# Patient Record
Sex: Female | Born: 1950 | Race: White | Hispanic: No | Marital: Married | Smoking: Former smoker
Health system: Southern US, Community
[De-identification: ages and names within clinical notes are randomized; demographics above are authoritative.]

---

## 1998-07-14 ENCOUNTER — Other Ambulatory Visit: Admission: RE | Admit: 1998-07-14 | Discharge: 1998-07-14 | Payer: Self-pay | Admitting: Family Medicine

## 2002-01-11 ENCOUNTER — Encounter: Payer: Self-pay | Admitting: Family Medicine

## 2002-01-11 ENCOUNTER — Encounter: Admission: RE | Admit: 2002-01-11 | Discharge: 2002-01-11 | Payer: Self-pay | Admitting: Family Medicine

## 2002-03-26 ENCOUNTER — Encounter: Payer: Self-pay | Admitting: Family Medicine

## 2002-03-26 ENCOUNTER — Encounter: Admission: RE | Admit: 2002-03-26 | Discharge: 2002-03-26 | Payer: Self-pay | Admitting: Family Medicine

## 2002-04-05 ENCOUNTER — Ambulatory Visit (HOSPITAL_COMMUNITY): Admission: RE | Admit: 2002-04-05 | Discharge: 2002-04-05 | Payer: Self-pay | Admitting: Gastroenterology

## 2002-04-24 ENCOUNTER — Encounter: Admission: RE | Admit: 2002-04-24 | Discharge: 2002-04-24 | Payer: Self-pay | Admitting: Family Medicine

## 2002-04-24 ENCOUNTER — Encounter: Payer: Self-pay | Admitting: Family Medicine

## 2003-01-04 ENCOUNTER — Encounter: Admission: RE | Admit: 2003-01-04 | Discharge: 2003-01-04 | Payer: Self-pay | Admitting: Family Medicine

## 2003-01-04 ENCOUNTER — Encounter: Payer: Self-pay | Admitting: Family Medicine

## 2003-04-02 ENCOUNTER — Ambulatory Visit (HOSPITAL_COMMUNITY): Admission: RE | Admit: 2003-04-02 | Discharge: 2003-04-02 | Payer: Self-pay | Admitting: Family Medicine

## 2003-04-02 ENCOUNTER — Encounter: Payer: Self-pay | Admitting: Family Medicine

## 2004-04-08 ENCOUNTER — Encounter: Admission: RE | Admit: 2004-04-08 | Discharge: 2004-04-08 | Payer: Self-pay | Admitting: Family Medicine

## 2004-09-17 ENCOUNTER — Other Ambulatory Visit: Admission: RE | Admit: 2004-09-17 | Discharge: 2004-09-17 | Payer: Self-pay | Admitting: Family Medicine

## 2005-04-13 ENCOUNTER — Ambulatory Visit (HOSPITAL_COMMUNITY): Admission: RE | Admit: 2005-04-13 | Discharge: 2005-04-13 | Payer: Self-pay | Admitting: Family Medicine

## 2005-09-28 ENCOUNTER — Other Ambulatory Visit: Admission: RE | Admit: 2005-09-28 | Discharge: 2005-09-28 | Payer: Self-pay | Admitting: Family Medicine

## 2005-09-28 ENCOUNTER — Encounter: Admission: RE | Admit: 2005-09-28 | Discharge: 2005-09-28 | Payer: Self-pay | Admitting: Family Medicine

## 2006-04-21 ENCOUNTER — Ambulatory Visit (HOSPITAL_COMMUNITY): Admission: RE | Admit: 2006-04-21 | Discharge: 2006-04-21 | Payer: Self-pay | Admitting: Family Medicine

## 2006-09-29 ENCOUNTER — Other Ambulatory Visit: Admission: RE | Admit: 2006-09-29 | Discharge: 2006-09-29 | Payer: Self-pay | Admitting: Family Medicine

## 2006-10-11 ENCOUNTER — Encounter: Admission: RE | Admit: 2006-10-11 | Discharge: 2006-10-11 | Payer: Self-pay | Admitting: Family Medicine

## 2007-04-24 ENCOUNTER — Ambulatory Visit (HOSPITAL_COMMUNITY): Admission: RE | Admit: 2007-04-24 | Discharge: 2007-04-24 | Payer: Self-pay | Admitting: Family Medicine

## 2007-10-10 ENCOUNTER — Other Ambulatory Visit: Admission: RE | Admit: 2007-10-10 | Discharge: 2007-10-10 | Payer: Self-pay | Admitting: Family Medicine

## 2008-04-24 ENCOUNTER — Ambulatory Visit (HOSPITAL_COMMUNITY): Admission: RE | Admit: 2008-04-24 | Discharge: 2008-04-24 | Payer: Self-pay | Admitting: Family Medicine

## 2008-10-14 ENCOUNTER — Other Ambulatory Visit: Admission: RE | Admit: 2008-10-14 | Discharge: 2008-10-14 | Payer: Self-pay | Admitting: Family Medicine

## 2009-04-28 ENCOUNTER — Ambulatory Visit (HOSPITAL_COMMUNITY): Admission: RE | Admit: 2009-04-28 | Discharge: 2009-04-28 | Payer: Self-pay | Admitting: Family Medicine

## 2009-10-21 ENCOUNTER — Other Ambulatory Visit: Admission: RE | Admit: 2009-10-21 | Discharge: 2009-10-21 | Payer: Self-pay | Admitting: Family Medicine

## 2009-10-31 ENCOUNTER — Other Ambulatory Visit: Admission: RE | Admit: 2009-10-31 | Discharge: 2009-10-31 | Payer: Self-pay | Admitting: Family Medicine

## 2010-04-30 ENCOUNTER — Ambulatory Visit (HOSPITAL_COMMUNITY): Admission: RE | Admit: 2010-04-30 | Discharge: 2010-04-30 | Payer: Self-pay | Admitting: Family Medicine

## 2011-03-29 ENCOUNTER — Other Ambulatory Visit (HOSPITAL_COMMUNITY): Payer: Self-pay | Admitting: Family Medicine

## 2011-03-29 DIAGNOSIS — Z1231 Encounter for screening mammogram for malignant neoplasm of breast: Secondary | ICD-10-CM

## 2011-04-09 NOTE — Procedures (Signed)
[place]. [facility]  Patient:    Gail Ellis, Gail Ellis Visit Number: [id number] MRN: [id number]          Service Type: END Location: ENDO Attending Physician:  Dennison Bulla Ii Dictated by:   Verlin Grills, M.D. Proc. Date: 04/05/02 Admit Date:  04/05/2002 Discharge Date: 04/05/2002   CC:         Desma Maxim, M.D.   Procedure Report  DATE OF BIRTH:  Oct 18, 1951.  REFERRING PHYSICIAN:  PROCEDURE PERFORMED:  Colonoscopy.  ENDOSCOPIST:  Verlin Grills, M.D.  INDICATIONS FOR PROCEDURE:  The patient is a 60 year old female.  [name] is to undergo her first screening colonoscopy with polypectomy to prevent colon cancer.  I discussed with the patient the complications associated with colonoscopy and polypectomy including a 15 per 1000 risk of bleeding and 4 per 1000 risk of colon perforation requiring surgical repair.  The patient has signed the operative permit.  PREMEDICATION:  Versed 5 mg, Demerol 50 mg.  ENDOSCOPE:  Olympus video colonoscope.  DESCRIPTION OF PROCEDURE:  After obtaining informed consent, the patient was placed in the left lateral decubitus position.  I administered intravenous Demerol and intravenous Versed to achieve conscious sedation for the procedure.  The patients blood pressure, oxygen saturation and cardiac rhythm were monitored throughout the procedure and documented in the medical record.  Anal inspection was normal.  Digital rectal exam was normal.  The Olympus pediatric video colonoscope was then introduced into the rectum and advanced to the cecum.  Colonic preparation for the exam today was excellent.  Rectum:  Normal.  Sigmoid colon and descending colon:  Normal.  Splenic flexure:  Normal.  Transverse colon:  Normal.  Hepatic flexure:  Normal.  Ascending colon:  Normal.  Cecum and ileocecal valve:  Normal.  ASSESSMENT:  Normal screening proctocolonoscopy to the cecum.   No endoscopic evidence for the presence of colorectal neoplasia.  Screening colonoscopy for colonic neoplasia misses 6% of polyps over 1 cm in size and 13% of polyps 5 to 9 mm in size. Dictated by:   Verlin Grills, M.D. Attending Physician:  Dennison Bulla Ii DD:  04/05/02 TD:  04/06/02 Job: [id number]

## 2011-05-04 ENCOUNTER — Ambulatory Visit (HOSPITAL_COMMUNITY)
Admission: RE | Admit: 2011-05-04 | Discharge: 2011-05-04 | Disposition: A | Discharge status: Home / Self Care | Payer: BC Managed Care – PPO | Source: Ambulatory Visit | Attending: Family Medicine | Admitting: Family Medicine

## 2011-05-04 DIAGNOSIS — Z1231 Encounter for screening mammogram for malignant neoplasm of breast: Secondary | ICD-10-CM | POA: Insufficient documentation

## 2011-11-01 ENCOUNTER — Other Ambulatory Visit (HOSPITAL_COMMUNITY)
Admission: RE | Admit: 2011-11-01 | Discharge: 2011-11-01 | Disposition: A | Discharge status: Home / Self Care | Payer: BC Managed Care – PPO | Source: Ambulatory Visit | Attending: Family Medicine | Admitting: Family Medicine

## 2011-11-01 ENCOUNTER — Other Ambulatory Visit: Payer: Self-pay | Admitting: Family Medicine

## 2011-11-01 DIAGNOSIS — Z124 Encounter for screening for malignant neoplasm of cervix: Secondary | ICD-10-CM | POA: Insufficient documentation

## 2012-04-18 ENCOUNTER — Other Ambulatory Visit (HOSPITAL_COMMUNITY): Payer: Self-pay | Admitting: Family Medicine

## 2012-04-18 DIAGNOSIS — Z1231 Encounter for screening mammogram for malignant neoplasm of breast: Secondary | ICD-10-CM

## 2012-05-11 ENCOUNTER — Ambulatory Visit (HOSPITAL_COMMUNITY)
Admission: RE | Admit: 2012-05-11 | Discharge: 2012-05-11 | Disposition: A | Discharge status: Home / Self Care | Payer: BC Managed Care – PPO | Source: Ambulatory Visit | Attending: Family Medicine | Admitting: Family Medicine

## 2012-05-11 DIAGNOSIS — Z1231 Encounter for screening mammogram for malignant neoplasm of breast: Secondary | ICD-10-CM | POA: Insufficient documentation

## 2013-04-19 ENCOUNTER — Other Ambulatory Visit (HOSPITAL_COMMUNITY): Payer: Self-pay | Admitting: Family Medicine

## 2013-04-19 DIAGNOSIS — Z1231 Encounter for screening mammogram for malignant neoplasm of breast: Secondary | ICD-10-CM

## 2013-05-17 ENCOUNTER — Ambulatory Visit (HOSPITAL_COMMUNITY)
Admission: RE | Admit: 2013-05-17 | Discharge: 2013-05-17 | Disposition: A | Discharge status: Home / Self Care | Payer: BC Managed Care – PPO | Source: Ambulatory Visit | Attending: Family Medicine | Admitting: Family Medicine

## 2013-05-17 DIAGNOSIS — Z1231 Encounter for screening mammogram for malignant neoplasm of breast: Secondary | ICD-10-CM | POA: Insufficient documentation

## 2014-04-08 ENCOUNTER — Other Ambulatory Visit (HOSPITAL_COMMUNITY): Payer: Self-pay | Admitting: Family Medicine

## 2014-04-08 DIAGNOSIS — Z1231 Encounter for screening mammogram for malignant neoplasm of breast: Secondary | ICD-10-CM

## 2014-06-04 ENCOUNTER — Ambulatory Visit (HOSPITAL_COMMUNITY): Payer: BC Managed Care – PPO

## 2014-06-17 ENCOUNTER — Ambulatory Visit (HOSPITAL_COMMUNITY)
Admission: RE | Admit: 2014-06-17 | Discharge: 2014-06-17 | Disposition: A | Discharge status: Home / Self Care | Payer: BC Managed Care – PPO | Source: Ambulatory Visit | Attending: Family Medicine | Admitting: Family Medicine

## 2014-06-17 ENCOUNTER — Other Ambulatory Visit (HOSPITAL_COMMUNITY): Payer: Self-pay | Admitting: Family Medicine

## 2014-06-17 DIAGNOSIS — Z1231 Encounter for screening mammogram for malignant neoplasm of breast: Secondary | ICD-10-CM | POA: Insufficient documentation

## 2014-12-26 ENCOUNTER — Other Ambulatory Visit: Payer: Self-pay | Admitting: Family Medicine

## 2014-12-26 ENCOUNTER — Other Ambulatory Visit (HOSPITAL_COMMUNITY)
Admission: RE | Admit: 2014-12-26 | Discharge: 2014-12-26 | Disposition: A | Discharge status: Home / Self Care | Payer: BLUE CROSS/BLUE SHIELD | Source: Ambulatory Visit | Attending: Family Medicine | Admitting: Family Medicine

## 2014-12-26 DIAGNOSIS — Z124 Encounter for screening for malignant neoplasm of cervix: Secondary | ICD-10-CM | POA: Diagnosis not present

## 2014-12-26 DIAGNOSIS — Z1151 Encounter for screening for human papillomavirus (HPV): Secondary | ICD-10-CM | POA: Insufficient documentation

## 2015-01-01 LAB — CYTOLOGY - PAP

## 2015-05-09 ENCOUNTER — Other Ambulatory Visit (HOSPITAL_COMMUNITY): Payer: Self-pay | Admitting: Family Medicine

## 2015-05-09 DIAGNOSIS — Z1231 Encounter for screening mammogram for malignant neoplasm of breast: Secondary | ICD-10-CM

## 2015-06-25 ENCOUNTER — Ambulatory Visit (HOSPITAL_COMMUNITY)
Admission: RE | Admit: 2015-06-25 | Discharge: 2015-06-25 | Disposition: A | Discharge status: Home / Self Care | Payer: BLUE CROSS/BLUE SHIELD | Source: Ambulatory Visit | Attending: Family Medicine | Admitting: Family Medicine

## 2015-06-25 ENCOUNTER — Other Ambulatory Visit (HOSPITAL_COMMUNITY): Payer: Self-pay | Admitting: Family Medicine

## 2015-06-25 DIAGNOSIS — Z1231 Encounter for screening mammogram for malignant neoplasm of breast: Secondary | ICD-10-CM

## 2016-02-03 DIAGNOSIS — M810 Age-related osteoporosis without current pathological fracture: Secondary | ICD-10-CM | POA: Diagnosis not present

## 2016-02-03 DIAGNOSIS — Z79899 Other long term (current) drug therapy: Secondary | ICD-10-CM | POA: Diagnosis not present

## 2016-02-03 DIAGNOSIS — G4700 Insomnia, unspecified: Secondary | ICD-10-CM | POA: Diagnosis not present

## 2016-02-03 DIAGNOSIS — M1990 Unspecified osteoarthritis, unspecified site: Secondary | ICD-10-CM | POA: Diagnosis not present

## 2016-02-03 DIAGNOSIS — G43909 Migraine, unspecified, not intractable, without status migrainosus: Secondary | ICD-10-CM | POA: Diagnosis not present

## 2016-02-03 DIAGNOSIS — N952 Postmenopausal atrophic vaginitis: Secondary | ICD-10-CM | POA: Diagnosis not present

## 2016-02-03 DIAGNOSIS — H3530 Unspecified macular degeneration: Secondary | ICD-10-CM | POA: Diagnosis not present

## 2016-02-03 DIAGNOSIS — Z136 Encounter for screening for cardiovascular disorders: Secondary | ICD-10-CM | POA: Diagnosis not present

## 2016-02-03 DIAGNOSIS — Z0000 Encounter for general adult medical examination without abnormal findings: Secondary | ICD-10-CM | POA: Diagnosis not present

## 2016-02-03 DIAGNOSIS — E559 Vitamin D deficiency, unspecified: Secondary | ICD-10-CM | POA: Diagnosis not present

## 2016-04-05 DIAGNOSIS — H6120 Impacted cerumen, unspecified ear: Secondary | ICD-10-CM | POA: Diagnosis not present

## 2016-06-03 ENCOUNTER — Other Ambulatory Visit: Payer: Self-pay | Admitting: Family Medicine

## 2016-06-03 DIAGNOSIS — Z1231 Encounter for screening mammogram for malignant neoplasm of breast: Secondary | ICD-10-CM

## 2016-07-12 ENCOUNTER — Ambulatory Visit: Payer: BLUE CROSS/BLUE SHIELD

## 2016-07-23 ENCOUNTER — Ambulatory Visit
Admission: RE | Admit: 2016-07-23 | Discharge: 2016-07-23 | Disposition: A | Discharge status: Home / Self Care | Payer: Medicare Other | Source: Ambulatory Visit | Attending: Family Medicine | Admitting: Family Medicine

## 2016-07-23 DIAGNOSIS — Z1231 Encounter for screening mammogram for malignant neoplasm of breast: Secondary | ICD-10-CM | POA: Diagnosis not present

## 2016-08-09 DIAGNOSIS — Z23 Encounter for immunization: Secondary | ICD-10-CM | POA: Diagnosis not present

## 2016-08-11 DIAGNOSIS — M810 Age-related osteoporosis without current pathological fracture: Secondary | ICD-10-CM | POA: Diagnosis not present

## 2016-08-17 DIAGNOSIS — H5203 Hypermetropia, bilateral: Secondary | ICD-10-CM | POA: Diagnosis not present

## 2016-08-17 DIAGNOSIS — H353131 Nonexudative age-related macular degeneration, bilateral, early dry stage: Secondary | ICD-10-CM | POA: Diagnosis not present

## 2016-08-17 DIAGNOSIS — H353122 Nonexudative age-related macular degeneration, left eye, intermediate dry stage: Secondary | ICD-10-CM | POA: Diagnosis not present

## 2016-09-06 DIAGNOSIS — D225 Melanocytic nevi of trunk: Secondary | ICD-10-CM | POA: Diagnosis not present

## 2016-09-06 DIAGNOSIS — D2261 Melanocytic nevi of right upper limb, including shoulder: Secondary | ICD-10-CM | POA: Diagnosis not present

## 2016-09-06 DIAGNOSIS — D234 Other benign neoplasm of skin of scalp and neck: Secondary | ICD-10-CM | POA: Diagnosis not present

## 2016-09-06 DIAGNOSIS — L821 Other seborrheic keratosis: Secondary | ICD-10-CM | POA: Diagnosis not present

## 2016-09-06 DIAGNOSIS — L280 Lichen simplex chronicus: Secondary | ICD-10-CM | POA: Diagnosis not present

## 2016-11-18 DIAGNOSIS — L820 Inflamed seborrheic keratosis: Secondary | ICD-10-CM | POA: Diagnosis not present

## 2016-11-18 DIAGNOSIS — D485 Neoplasm of uncertain behavior of skin: Secondary | ICD-10-CM | POA: Diagnosis not present

## 2017-03-16 DIAGNOSIS — H6123 Impacted cerumen, bilateral: Secondary | ICD-10-CM | POA: Diagnosis not present

## 2017-03-24 ENCOUNTER — Other Ambulatory Visit: Payer: Self-pay | Admitting: Family Medicine

## 2017-03-24 ENCOUNTER — Other Ambulatory Visit (HOSPITAL_COMMUNITY)
Admission: RE | Admit: 2017-03-24 | Discharge: 2017-03-24 | Disposition: A | Discharge status: Home / Self Care | Payer: Medicare Other | Source: Ambulatory Visit | Attending: Family Medicine | Admitting: Family Medicine

## 2017-03-24 DIAGNOSIS — Z0000 Encounter for general adult medical examination without abnormal findings: Secondary | ICD-10-CM | POA: Diagnosis not present

## 2017-03-24 DIAGNOSIS — Z1159 Encounter for screening for other viral diseases: Secondary | ICD-10-CM | POA: Diagnosis not present

## 2017-03-24 DIAGNOSIS — Z79899 Other long term (current) drug therapy: Secondary | ICD-10-CM | POA: Diagnosis not present

## 2017-03-24 DIAGNOSIS — Z01419 Encounter for gynecological examination (general) (routine) without abnormal findings: Secondary | ICD-10-CM | POA: Diagnosis not present

## 2017-03-24 DIAGNOSIS — Z23 Encounter for immunization: Secondary | ICD-10-CM | POA: Diagnosis not present

## 2017-03-24 DIAGNOSIS — Z1211 Encounter for screening for malignant neoplasm of colon: Secondary | ICD-10-CM | POA: Diagnosis not present

## 2017-03-24 DIAGNOSIS — N952 Postmenopausal atrophic vaginitis: Secondary | ICD-10-CM | POA: Diagnosis not present

## 2017-03-24 DIAGNOSIS — M1990 Unspecified osteoarthritis, unspecified site: Secondary | ICD-10-CM | POA: Diagnosis not present

## 2017-03-24 DIAGNOSIS — E559 Vitamin D deficiency, unspecified: Secondary | ICD-10-CM | POA: Diagnosis not present

## 2017-03-24 DIAGNOSIS — Z1151 Encounter for screening for human papillomavirus (HPV): Secondary | ICD-10-CM | POA: Insufficient documentation

## 2017-03-24 DIAGNOSIS — Z124 Encounter for screening for malignant neoplasm of cervix: Secondary | ICD-10-CM | POA: Diagnosis not present

## 2017-03-24 DIAGNOSIS — M810 Age-related osteoporosis without current pathological fracture: Secondary | ICD-10-CM | POA: Diagnosis not present

## 2017-03-24 DIAGNOSIS — G4700 Insomnia, unspecified: Secondary | ICD-10-CM | POA: Diagnosis not present

## 2017-03-24 DIAGNOSIS — G43909 Migraine, unspecified, not intractable, without status migrainosus: Secondary | ICD-10-CM | POA: Diagnosis not present

## 2017-03-25 DIAGNOSIS — K130 Diseases of lips: Secondary | ICD-10-CM | POA: Diagnosis not present

## 2017-03-29 LAB — CYTOLOGY - PAP
DIAGNOSIS: NEGATIVE
HPV: NOT DETECTED

## 2017-05-02 DIAGNOSIS — M810 Age-related osteoporosis without current pathological fracture: Secondary | ICD-10-CM | POA: Diagnosis not present

## 2017-05-02 DIAGNOSIS — Z1211 Encounter for screening for malignant neoplasm of colon: Secondary | ICD-10-CM | POA: Diagnosis not present

## 2017-06-14 ENCOUNTER — Other Ambulatory Visit: Payer: Self-pay | Admitting: Family Medicine

## 2017-06-14 DIAGNOSIS — Z1231 Encounter for screening mammogram for malignant neoplasm of breast: Secondary | ICD-10-CM

## 2017-08-08 ENCOUNTER — Ambulatory Visit: Payer: Medicare Other

## 2017-08-15 ENCOUNTER — Ambulatory Visit
Admission: RE | Admit: 2017-08-15 | Discharge: 2017-08-15 | Disposition: A | Discharge status: Home / Self Care | Payer: Medicare Other | Source: Ambulatory Visit | Attending: Family Medicine | Admitting: Family Medicine

## 2017-08-15 DIAGNOSIS — Z1231 Encounter for screening mammogram for malignant neoplasm of breast: Secondary | ICD-10-CM

## 2017-08-19 DIAGNOSIS — H2513 Age-related nuclear cataract, bilateral: Secondary | ICD-10-CM | POA: Diagnosis not present

## 2017-08-19 DIAGNOSIS — Z23 Encounter for immunization: Secondary | ICD-10-CM | POA: Diagnosis not present

## 2017-08-19 DIAGNOSIS — H52203 Unspecified astigmatism, bilateral: Secondary | ICD-10-CM | POA: Diagnosis not present

## 2017-08-19 DIAGNOSIS — H353131 Nonexudative age-related macular degeneration, bilateral, early dry stage: Secondary | ICD-10-CM | POA: Diagnosis not present

## 2017-09-12 DIAGNOSIS — D224 Melanocytic nevi of scalp and neck: Secondary | ICD-10-CM | POA: Diagnosis not present

## 2017-09-12 DIAGNOSIS — L821 Other seborrheic keratosis: Secondary | ICD-10-CM | POA: Diagnosis not present

## 2017-09-12 DIAGNOSIS — D2261 Melanocytic nevi of right upper limb, including shoulder: Secondary | ICD-10-CM | POA: Diagnosis not present

## 2017-09-12 DIAGNOSIS — D225 Melanocytic nevi of trunk: Secondary | ICD-10-CM | POA: Diagnosis not present

## 2017-09-12 DIAGNOSIS — D1801 Hemangioma of skin and subcutaneous tissue: Secondary | ICD-10-CM | POA: Diagnosis not present

## 2017-09-14 DIAGNOSIS — M810 Age-related osteoporosis without current pathological fracture: Secondary | ICD-10-CM | POA: Diagnosis not present

## 2018-04-06 DIAGNOSIS — M1990 Unspecified osteoarthritis, unspecified site: Secondary | ICD-10-CM | POA: Diagnosis not present

## 2018-04-06 DIAGNOSIS — R030 Elevated blood-pressure reading, without diagnosis of hypertension: Secondary | ICD-10-CM | POA: Diagnosis not present

## 2018-04-06 DIAGNOSIS — Z0000 Encounter for general adult medical examination without abnormal findings: Secondary | ICD-10-CM | POA: Diagnosis not present

## 2018-04-06 DIAGNOSIS — E559 Vitamin D deficiency, unspecified: Secondary | ICD-10-CM | POA: Diagnosis not present

## 2018-04-06 DIAGNOSIS — H3530 Unspecified macular degeneration: Secondary | ICD-10-CM | POA: Diagnosis not present

## 2018-04-06 DIAGNOSIS — G43909 Migraine, unspecified, not intractable, without status migrainosus: Secondary | ICD-10-CM | POA: Diagnosis not present

## 2018-04-06 DIAGNOSIS — M810 Age-related osteoporosis without current pathological fracture: Secondary | ICD-10-CM | POA: Diagnosis not present

## 2018-04-06 DIAGNOSIS — Z136 Encounter for screening for cardiovascular disorders: Secondary | ICD-10-CM | POA: Diagnosis not present

## 2018-04-06 DIAGNOSIS — N952 Postmenopausal atrophic vaginitis: Secondary | ICD-10-CM | POA: Diagnosis not present

## 2018-04-06 DIAGNOSIS — Z79899 Other long term (current) drug therapy: Secondary | ICD-10-CM | POA: Diagnosis not present

## 2018-04-06 DIAGNOSIS — G4700 Insomnia, unspecified: Secondary | ICD-10-CM | POA: Diagnosis not present

## 2018-07-07 ENCOUNTER — Other Ambulatory Visit: Payer: Self-pay | Admitting: Family Medicine

## 2018-07-07 DIAGNOSIS — Z1231 Encounter for screening mammogram for malignant neoplasm of breast: Secondary | ICD-10-CM

## 2018-07-11 DIAGNOSIS — H1132 Conjunctival hemorrhage, left eye: Secondary | ICD-10-CM | POA: Diagnosis not present

## 2018-08-17 ENCOUNTER — Ambulatory Visit
Admission: RE | Admit: 2018-08-17 | Discharge: 2018-08-17 | Disposition: A | Discharge status: Home / Self Care | Payer: Medicare Other | Source: Ambulatory Visit | Attending: Family Medicine | Admitting: Family Medicine

## 2018-08-17 DIAGNOSIS — Z1231 Encounter for screening mammogram for malignant neoplasm of breast: Secondary | ICD-10-CM | POA: Diagnosis not present

## 2018-08-28 DIAGNOSIS — H5203 Hypermetropia, bilateral: Secondary | ICD-10-CM | POA: Diagnosis not present

## 2018-08-28 DIAGNOSIS — H2513 Age-related nuclear cataract, bilateral: Secondary | ICD-10-CM | POA: Diagnosis not present

## 2018-08-28 DIAGNOSIS — H353132 Nonexudative age-related macular degeneration, bilateral, intermediate dry stage: Secondary | ICD-10-CM | POA: Diagnosis not present

## 2018-09-12 DIAGNOSIS — D225 Melanocytic nevi of trunk: Secondary | ICD-10-CM | POA: Diagnosis not present

## 2018-09-12 DIAGNOSIS — D2262 Melanocytic nevi of left upper limb, including shoulder: Secondary | ICD-10-CM | POA: Diagnosis not present

## 2018-09-12 DIAGNOSIS — L738 Other specified follicular disorders: Secondary | ICD-10-CM | POA: Diagnosis not present

## 2018-09-12 DIAGNOSIS — L821 Other seborrheic keratosis: Secondary | ICD-10-CM | POA: Diagnosis not present

## 2018-09-12 DIAGNOSIS — L918 Other hypertrophic disorders of the skin: Secondary | ICD-10-CM | POA: Diagnosis not present

## 2018-09-12 DIAGNOSIS — D1801 Hemangioma of skin and subcutaneous tissue: Secondary | ICD-10-CM | POA: Diagnosis not present

## 2018-09-12 DIAGNOSIS — D224 Melanocytic nevi of scalp and neck: Secondary | ICD-10-CM | POA: Diagnosis not present

## 2018-09-17 DIAGNOSIS — Z23 Encounter for immunization: Secondary | ICD-10-CM | POA: Diagnosis not present

## 2019-04-19 DIAGNOSIS — Z7189 Other specified counseling: Secondary | ICD-10-CM | POA: Diagnosis not present

## 2019-04-19 DIAGNOSIS — R030 Elevated blood-pressure reading, without diagnosis of hypertension: Secondary | ICD-10-CM | POA: Diagnosis not present

## 2019-04-19 DIAGNOSIS — G43909 Migraine, unspecified, not intractable, without status migrainosus: Secondary | ICD-10-CM | POA: Diagnosis not present

## 2019-04-19 DIAGNOSIS — H3530 Unspecified macular degeneration: Secondary | ICD-10-CM | POA: Diagnosis not present

## 2019-04-19 DIAGNOSIS — J309 Allergic rhinitis, unspecified: Secondary | ICD-10-CM | POA: Diagnosis not present

## 2019-04-19 DIAGNOSIS — M810 Age-related osteoporosis without current pathological fracture: Secondary | ICD-10-CM | POA: Diagnosis not present

## 2019-04-19 DIAGNOSIS — M1990 Unspecified osteoarthritis, unspecified site: Secondary | ICD-10-CM | POA: Diagnosis not present

## 2019-04-19 DIAGNOSIS — F4321 Adjustment disorder with depressed mood: Secondary | ICD-10-CM | POA: Diagnosis not present

## 2019-04-19 DIAGNOSIS — Z79899 Other long term (current) drug therapy: Secondary | ICD-10-CM | POA: Diagnosis not present

## 2019-04-19 DIAGNOSIS — E559 Vitamin D deficiency, unspecified: Secondary | ICD-10-CM | POA: Diagnosis not present

## 2019-04-19 DIAGNOSIS — N952 Postmenopausal atrophic vaginitis: Secondary | ICD-10-CM | POA: Diagnosis not present

## 2019-04-19 DIAGNOSIS — Z0000 Encounter for general adult medical examination without abnormal findings: Secondary | ICD-10-CM | POA: Diagnosis not present

## 2019-04-27 DIAGNOSIS — H9203 Otalgia, bilateral: Secondary | ICD-10-CM | POA: Diagnosis not present

## 2019-07-09 ENCOUNTER — Other Ambulatory Visit: Payer: Self-pay | Admitting: Advanced Practice Midwife

## 2019-07-09 DIAGNOSIS — Z1231 Encounter for screening mammogram for malignant neoplasm of breast: Secondary | ICD-10-CM

## 2019-08-15 DIAGNOSIS — M810 Age-related osteoporosis without current pathological fracture: Secondary | ICD-10-CM | POA: Diagnosis not present

## 2019-08-20 ENCOUNTER — Other Ambulatory Visit: Payer: Self-pay

## 2019-08-20 ENCOUNTER — Ambulatory Visit
Admission: RE | Admit: 2019-08-20 | Discharge: 2019-08-20 | Disposition: A | Discharge status: Home / Self Care | Payer: Medicare Other | Source: Ambulatory Visit | Attending: Advanced Practice Midwife | Admitting: Advanced Practice Midwife

## 2019-08-20 DIAGNOSIS — Z1231 Encounter for screening mammogram for malignant neoplasm of breast: Secondary | ICD-10-CM

## 2019-09-03 DIAGNOSIS — H2513 Age-related nuclear cataract, bilateral: Secondary | ICD-10-CM | POA: Diagnosis not present

## 2019-09-03 DIAGNOSIS — H353131 Nonexudative age-related macular degeneration, bilateral, early dry stage: Secondary | ICD-10-CM | POA: Diagnosis not present

## 2019-09-03 DIAGNOSIS — H52203 Unspecified astigmatism, bilateral: Secondary | ICD-10-CM | POA: Diagnosis not present

## 2019-09-15 DIAGNOSIS — Z23 Encounter for immunization: Secondary | ICD-10-CM | POA: Diagnosis not present

## 2019-09-25 DIAGNOSIS — D224 Melanocytic nevi of scalp and neck: Secondary | ICD-10-CM | POA: Diagnosis not present

## 2019-09-25 DIAGNOSIS — L821 Other seborrheic keratosis: Secondary | ICD-10-CM | POA: Diagnosis not present

## 2019-09-25 DIAGNOSIS — D225 Melanocytic nevi of trunk: Secondary | ICD-10-CM | POA: Diagnosis not present

## 2019-09-25 DIAGNOSIS — D1801 Hemangioma of skin and subcutaneous tissue: Secondary | ICD-10-CM | POA: Diagnosis not present

## 2019-09-25 DIAGNOSIS — D2271 Melanocytic nevi of right lower limb, including hip: Secondary | ICD-10-CM | POA: Diagnosis not present

## 2019-09-25 DIAGNOSIS — L820 Inflamed seborrheic keratosis: Secondary | ICD-10-CM | POA: Diagnosis not present

## 2019-09-25 DIAGNOSIS — L738 Other specified follicular disorders: Secondary | ICD-10-CM | POA: Diagnosis not present

## 2019-09-25 DIAGNOSIS — L720 Epidermal cyst: Secondary | ICD-10-CM | POA: Diagnosis not present

## 2019-12-25 DIAGNOSIS — Z1159 Encounter for screening for other viral diseases: Secondary | ICD-10-CM | POA: Diagnosis not present

## 2019-12-28 DIAGNOSIS — Z1211 Encounter for screening for malignant neoplasm of colon: Secondary | ICD-10-CM | POA: Diagnosis not present

## 2019-12-30 ENCOUNTER — Ambulatory Visit: Payer: Medicare Other

## 2020-01-15 ENCOUNTER — Ambulatory Visit: Payer: Medicare Other

## 2020-02-13 DIAGNOSIS — M810 Age-related osteoporosis without current pathological fracture: Secondary | ICD-10-CM | POA: Diagnosis not present

## 2020-04-22 DIAGNOSIS — E559 Vitamin D deficiency, unspecified: Secondary | ICD-10-CM | POA: Diagnosis not present

## 2020-04-22 DIAGNOSIS — G43909 Migraine, unspecified, not intractable, without status migrainosus: Secondary | ICD-10-CM | POA: Diagnosis not present

## 2020-04-22 DIAGNOSIS — H3530 Unspecified macular degeneration: Secondary | ICD-10-CM | POA: Diagnosis not present

## 2020-04-22 DIAGNOSIS — J309 Allergic rhinitis, unspecified: Secondary | ICD-10-CM | POA: Diagnosis not present

## 2020-04-22 DIAGNOSIS — Z0000 Encounter for general adult medical examination without abnormal findings: Secondary | ICD-10-CM | POA: Diagnosis not present

## 2020-04-22 DIAGNOSIS — M810 Age-related osteoporosis without current pathological fracture: Secondary | ICD-10-CM | POA: Diagnosis not present

## 2020-04-22 DIAGNOSIS — Z79899 Other long term (current) drug therapy: Secondary | ICD-10-CM | POA: Diagnosis not present

## 2020-04-22 DIAGNOSIS — N952 Postmenopausal atrophic vaginitis: Secondary | ICD-10-CM | POA: Diagnosis not present

## 2020-04-22 DIAGNOSIS — M1990 Unspecified osteoarthritis, unspecified site: Secondary | ICD-10-CM | POA: Diagnosis not present

## 2020-07-17 ENCOUNTER — Other Ambulatory Visit: Payer: Self-pay | Admitting: Advanced Practice Midwife

## 2020-07-17 DIAGNOSIS — Z1231 Encounter for screening mammogram for malignant neoplasm of breast: Secondary | ICD-10-CM

## 2020-08-20 DIAGNOSIS — M810 Age-related osteoporosis without current pathological fracture: Secondary | ICD-10-CM | POA: Diagnosis not present

## 2020-08-26 ENCOUNTER — Other Ambulatory Visit: Payer: Self-pay

## 2020-08-26 ENCOUNTER — Ambulatory Visit
Admission: RE | Admit: 2020-08-26 | Discharge: 2020-08-26 | Disposition: A | Discharge status: Home / Self Care | Payer: Medicare Other | Source: Ambulatory Visit | Attending: Advanced Practice Midwife | Admitting: Advanced Practice Midwife

## 2020-08-26 ENCOUNTER — Other Ambulatory Visit: Payer: Self-pay | Admitting: Family Medicine

## 2020-08-26 DIAGNOSIS — Z1231 Encounter for screening mammogram for malignant neoplasm of breast: Secondary | ICD-10-CM | POA: Diagnosis not present

## 2020-09-09 DIAGNOSIS — H353132 Nonexudative age-related macular degeneration, bilateral, intermediate dry stage: Secondary | ICD-10-CM | POA: Diagnosis not present

## 2020-09-09 DIAGNOSIS — H52203 Unspecified astigmatism, bilateral: Secondary | ICD-10-CM | POA: Diagnosis not present

## 2020-09-09 DIAGNOSIS — H40053 Ocular hypertension, bilateral: Secondary | ICD-10-CM | POA: Diagnosis not present

## 2020-09-09 DIAGNOSIS — H2513 Age-related nuclear cataract, bilateral: Secondary | ICD-10-CM | POA: Diagnosis not present

## 2020-09-13 DIAGNOSIS — Z23 Encounter for immunization: Secondary | ICD-10-CM | POA: Diagnosis not present

## 2020-10-07 DIAGNOSIS — D224 Melanocytic nevi of scalp and neck: Secondary | ICD-10-CM | POA: Diagnosis not present

## 2020-10-07 DIAGNOSIS — D2271 Melanocytic nevi of right lower limb, including hip: Secondary | ICD-10-CM | POA: Diagnosis not present

## 2020-10-07 DIAGNOSIS — L858 Other specified epidermal thickening: Secondary | ICD-10-CM | POA: Diagnosis not present

## 2020-10-07 DIAGNOSIS — D225 Melanocytic nevi of trunk: Secondary | ICD-10-CM | POA: Diagnosis not present

## 2020-10-07 DIAGNOSIS — L821 Other seborrheic keratosis: Secondary | ICD-10-CM | POA: Diagnosis not present

## 2020-10-07 DIAGNOSIS — D1801 Hemangioma of skin and subcutaneous tissue: Secondary | ICD-10-CM | POA: Diagnosis not present

## 2020-10-21 DIAGNOSIS — H6123 Impacted cerumen, bilateral: Secondary | ICD-10-CM | POA: Diagnosis not present

## 2020-11-09 DIAGNOSIS — Z23 Encounter for immunization: Secondary | ICD-10-CM | POA: Diagnosis not present

## 2021-02-18 DIAGNOSIS — M810 Age-related osteoporosis without current pathological fracture: Secondary | ICD-10-CM | POA: Diagnosis not present

## 2021-04-23 DIAGNOSIS — G43909 Migraine, unspecified, not intractable, without status migrainosus: Secondary | ICD-10-CM | POA: Diagnosis not present

## 2021-04-23 DIAGNOSIS — J309 Allergic rhinitis, unspecified: Secondary | ICD-10-CM | POA: Diagnosis not present

## 2021-04-23 DIAGNOSIS — H3530 Unspecified macular degeneration: Secondary | ICD-10-CM | POA: Diagnosis not present

## 2021-04-23 DIAGNOSIS — Z0000 Encounter for general adult medical examination without abnormal findings: Secondary | ICD-10-CM | POA: Diagnosis not present

## 2021-04-23 DIAGNOSIS — N952 Postmenopausal atrophic vaginitis: Secondary | ICD-10-CM | POA: Diagnosis not present

## 2021-04-23 DIAGNOSIS — M810 Age-related osteoporosis without current pathological fracture: Secondary | ICD-10-CM | POA: Diagnosis not present

## 2021-04-23 DIAGNOSIS — Z79899 Other long term (current) drug therapy: Secondary | ICD-10-CM | POA: Diagnosis not present

## 2021-04-23 DIAGNOSIS — Z23 Encounter for immunization: Secondary | ICD-10-CM | POA: Diagnosis not present

## 2021-04-23 DIAGNOSIS — M1990 Unspecified osteoarthritis, unspecified site: Secondary | ICD-10-CM | POA: Diagnosis not present

## 2021-04-23 DIAGNOSIS — E559 Vitamin D deficiency, unspecified: Secondary | ICD-10-CM | POA: Diagnosis not present

## 2021-07-22 ENCOUNTER — Other Ambulatory Visit: Payer: Self-pay | Admitting: Family Medicine

## 2021-07-22 DIAGNOSIS — Z1231 Encounter for screening mammogram for malignant neoplasm of breast: Secondary | ICD-10-CM

## 2021-08-25 DIAGNOSIS — M810 Age-related osteoporosis without current pathological fracture: Secondary | ICD-10-CM | POA: Diagnosis not present

## 2021-09-01 ENCOUNTER — Ambulatory Visit
Admission: RE | Admit: 2021-09-01 | Discharge: 2021-09-01 | Disposition: A | Discharge status: Home / Self Care | Payer: Medicare Other | Source: Ambulatory Visit | Attending: Family Medicine | Admitting: Family Medicine

## 2021-09-01 ENCOUNTER — Other Ambulatory Visit: Payer: Self-pay

## 2021-09-01 DIAGNOSIS — Z1231 Encounter for screening mammogram for malignant neoplasm of breast: Secondary | ICD-10-CM | POA: Diagnosis not present

## 2021-09-14 DIAGNOSIS — H353132 Nonexudative age-related macular degeneration, bilateral, intermediate dry stage: Secondary | ICD-10-CM | POA: Diagnosis not present

## 2021-09-14 DIAGNOSIS — H2513 Age-related nuclear cataract, bilateral: Secondary | ICD-10-CM | POA: Diagnosis not present

## 2021-09-14 DIAGNOSIS — H5203 Hypermetropia, bilateral: Secondary | ICD-10-CM | POA: Diagnosis not present

## 2021-10-03 DIAGNOSIS — Z23 Encounter for immunization: Secondary | ICD-10-CM | POA: Diagnosis not present

## 2021-10-12 DIAGNOSIS — L918 Other hypertrophic disorders of the skin: Secondary | ICD-10-CM | POA: Diagnosis not present

## 2021-10-12 DIAGNOSIS — L821 Other seborrheic keratosis: Secondary | ICD-10-CM | POA: Diagnosis not present

## 2021-10-12 DIAGNOSIS — D224 Melanocytic nevi of scalp and neck: Secondary | ICD-10-CM | POA: Diagnosis not present

## 2021-10-12 DIAGNOSIS — L308 Other specified dermatitis: Secondary | ICD-10-CM | POA: Diagnosis not present

## 2021-10-12 DIAGNOSIS — D1801 Hemangioma of skin and subcutaneous tissue: Secondary | ICD-10-CM | POA: Diagnosis not present

## 2021-10-12 DIAGNOSIS — L820 Inflamed seborrheic keratosis: Secondary | ICD-10-CM | POA: Diagnosis not present

## 2022-02-25 DIAGNOSIS — M810 Age-related osteoporosis without current pathological fracture: Secondary | ICD-10-CM | POA: Diagnosis not present

## 2022-03-10 IMAGING — MG DIGITAL SCREENING BILAT W/ TOMO W/ CAD
8 series · 8 of 24 positions shown · non-contrast
Comparison: Previous exam(s).

CLINICAL DATA: Screening.

EXAM:
DIGITAL SCREENING BILATERAL MAMMOGRAM WITH TOMO AND CAD

[L CC synth-2D]
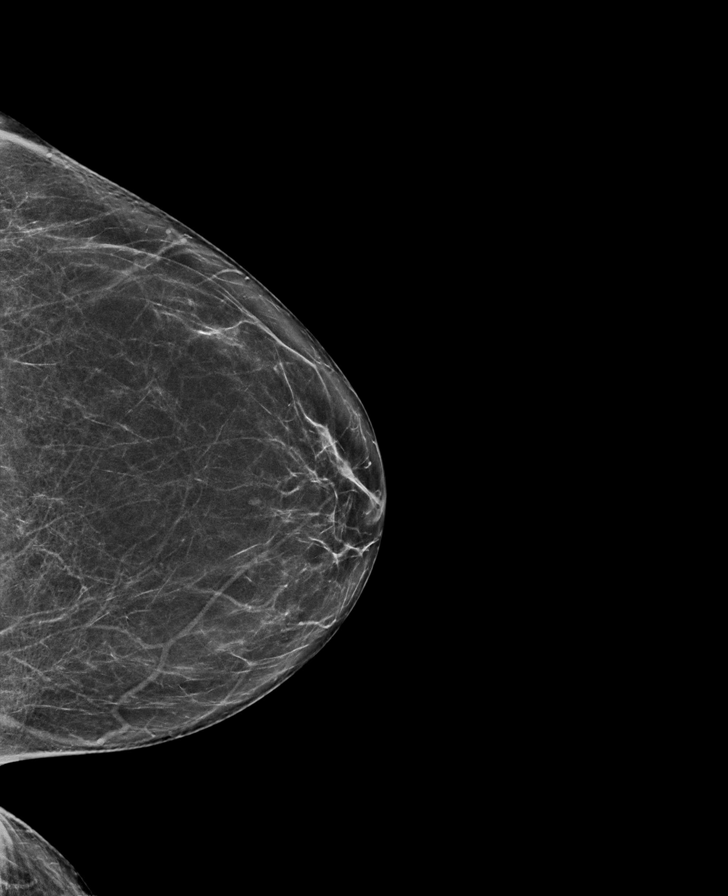

[L MLO synth-2D]
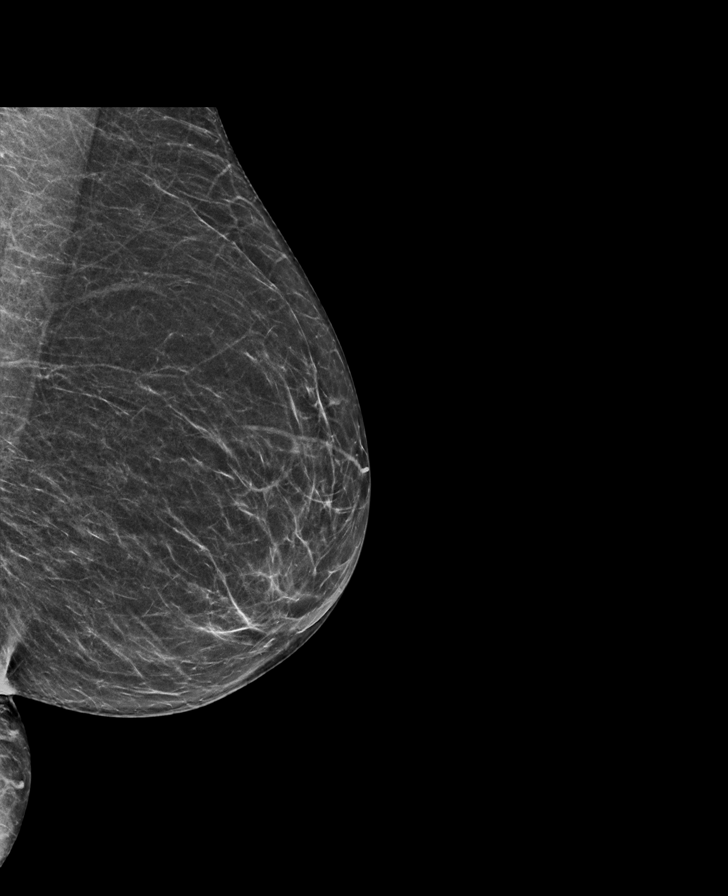

[R MLO synth-2D]
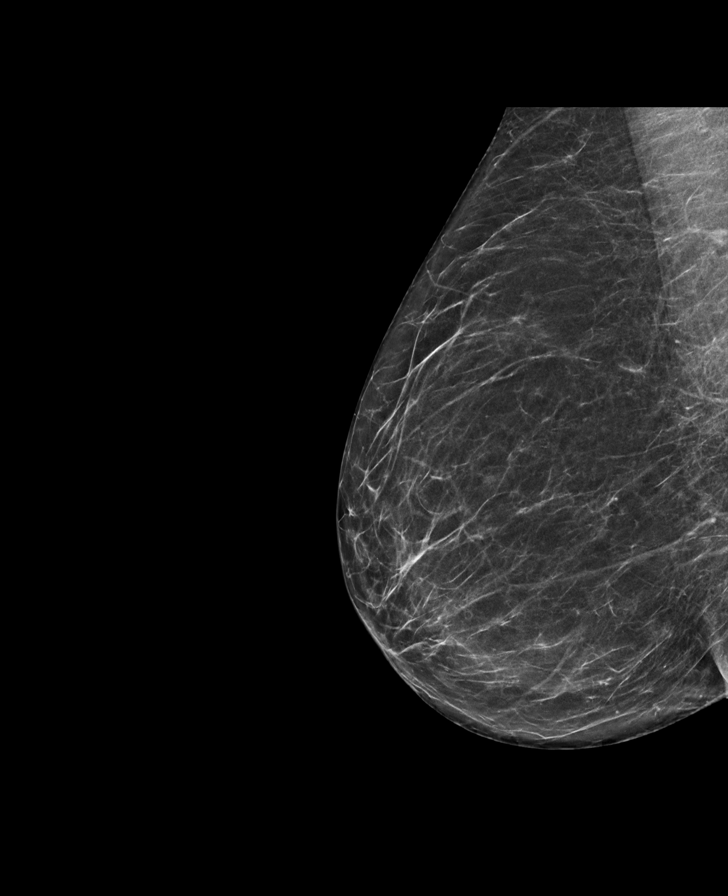

[R CC synth-2D]
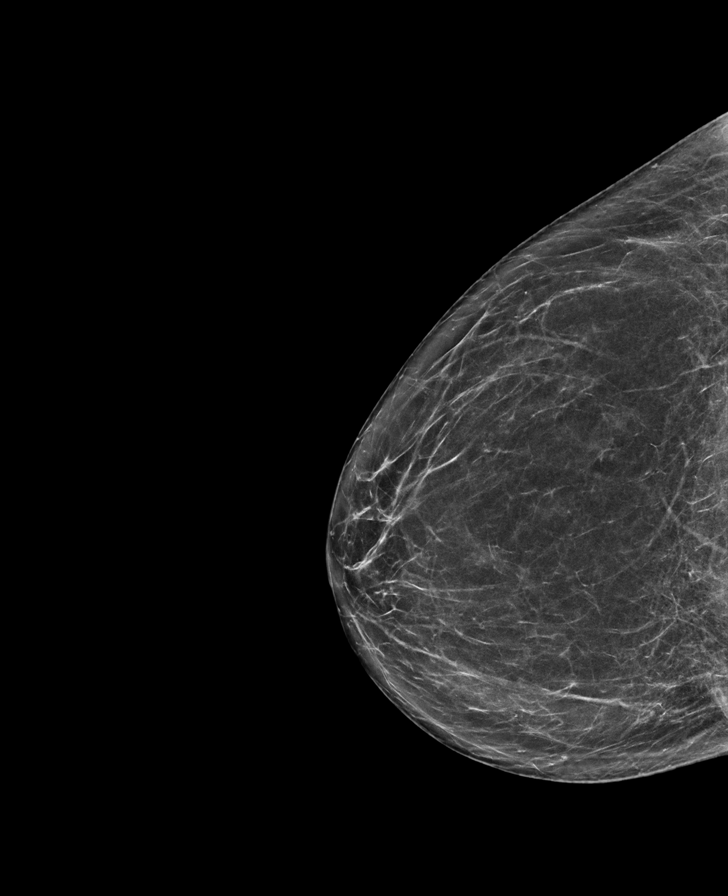

[R MLO tomo · tomo slice 33/65.0]
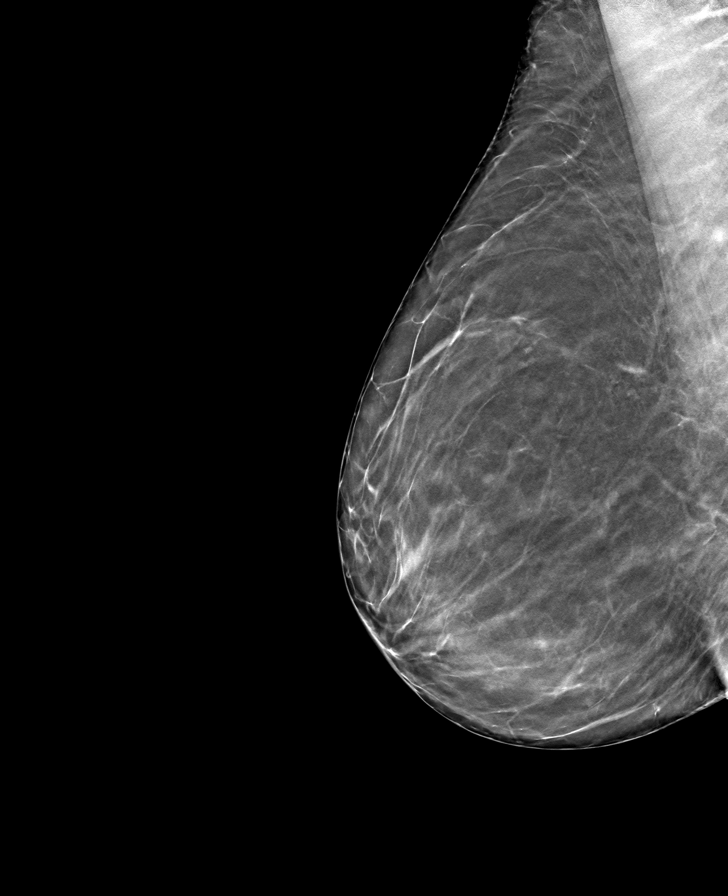

[R CC tomo · tomo slice 35/68.0]
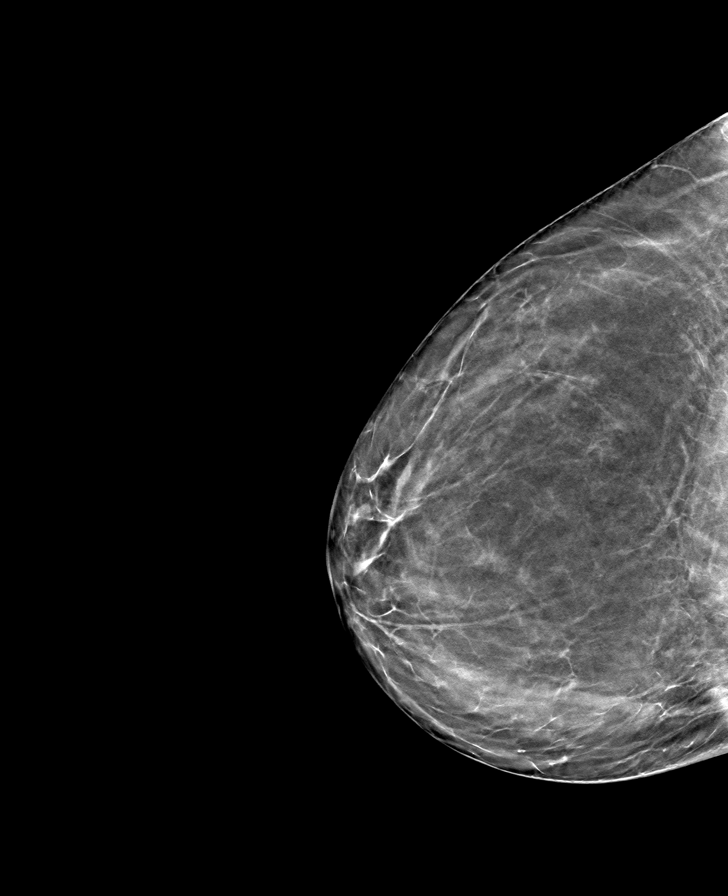

[L MLO tomo · tomo slice 31/60.0]
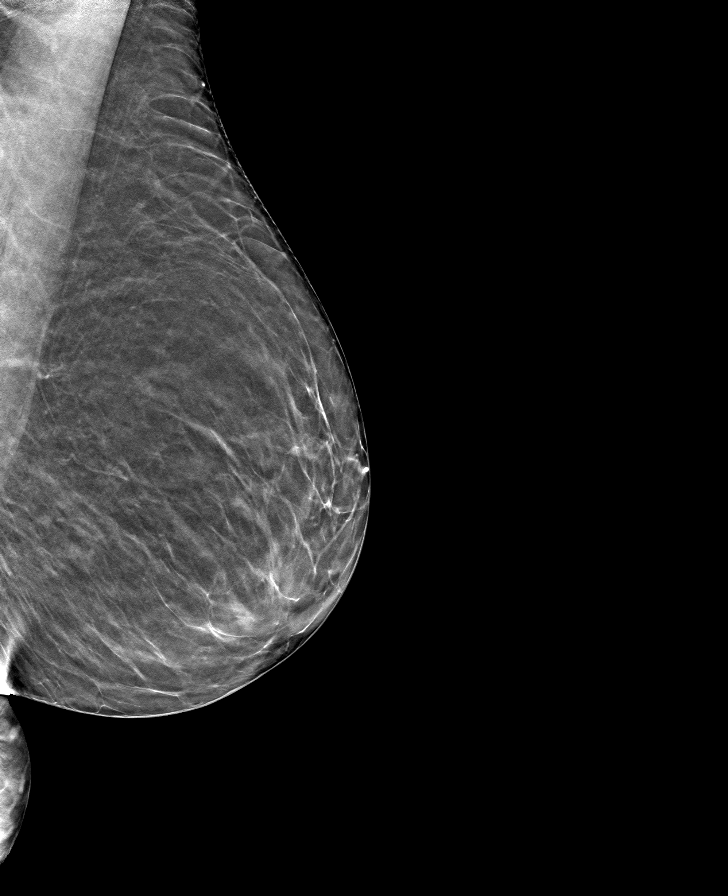

[L CC tomo · tomo slice 33/66.0]
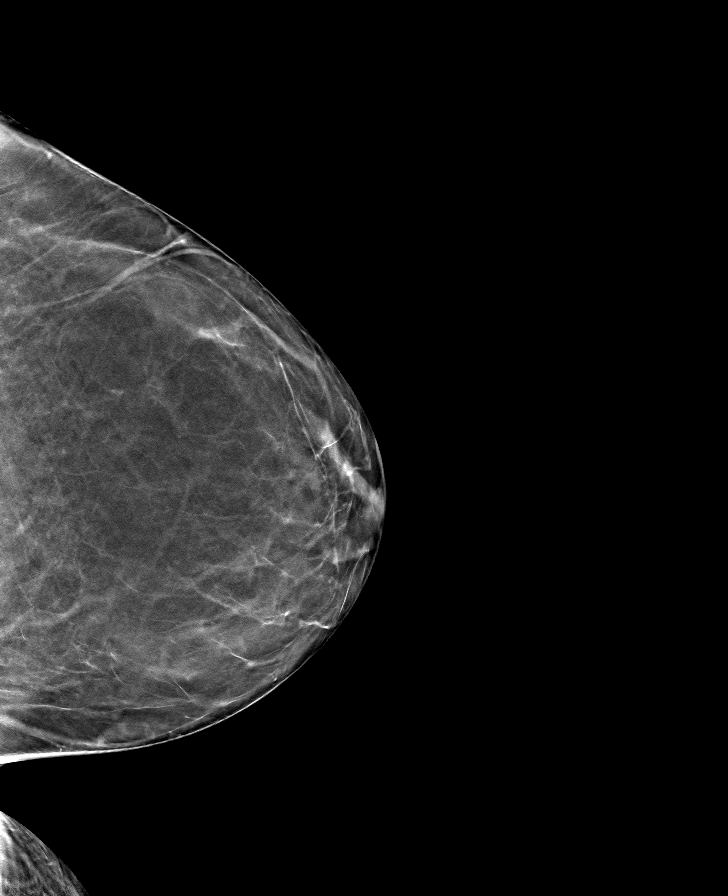

[8 of 24 positions shown; findings below may reference images not displayed]

ACR Breast Density Category b: There are scattered areas of
fibroglandular density.
FINDINGS: There are no findings suspicious for malignancy. Images were
processed with CAD.
IMPRESSION: No mammographic evidence of malignancy. A result letter of this
screening mammogram will be mailed directly to the patient.

RECOMMENDATION:
Screening mammogram in one year. (Code:[id number]-775)

BI-RADS CATEGORY  1: Negative.

## 2022-05-18 DIAGNOSIS — M722 Plantar fascial fibromatosis: Secondary | ICD-10-CM | POA: Diagnosis not present

## 2022-05-18 DIAGNOSIS — M810 Age-related osteoporosis without current pathological fracture: Secondary | ICD-10-CM | POA: Diagnosis not present

## 2022-05-18 DIAGNOSIS — J309 Allergic rhinitis, unspecified: Secondary | ICD-10-CM | POA: Diagnosis not present

## 2022-05-18 DIAGNOSIS — M1990 Unspecified osteoarthritis, unspecified site: Secondary | ICD-10-CM | POA: Diagnosis not present

## 2022-05-18 DIAGNOSIS — N952 Postmenopausal atrophic vaginitis: Secondary | ICD-10-CM | POA: Diagnosis not present

## 2022-05-18 DIAGNOSIS — E559 Vitamin D deficiency, unspecified: Secondary | ICD-10-CM | POA: Diagnosis not present

## 2022-05-18 DIAGNOSIS — H3530 Unspecified macular degeneration: Secondary | ICD-10-CM | POA: Diagnosis not present

## 2022-05-18 DIAGNOSIS — Z79899 Other long term (current) drug therapy: Secondary | ICD-10-CM | POA: Diagnosis not present

## 2022-05-18 DIAGNOSIS — Z0000 Encounter for general adult medical examination without abnormal findings: Secondary | ICD-10-CM | POA: Diagnosis not present

## 2022-05-18 DIAGNOSIS — Z23 Encounter for immunization: Secondary | ICD-10-CM | POA: Diagnosis not present

## 2022-05-18 DIAGNOSIS — G43909 Migraine, unspecified, not intractable, without status migrainosus: Secondary | ICD-10-CM | POA: Diagnosis not present

## 2022-07-23 ENCOUNTER — Other Ambulatory Visit: Payer: Self-pay | Admitting: Family Medicine

## 2022-07-23 DIAGNOSIS — Z1231 Encounter for screening mammogram for malignant neoplasm of breast: Secondary | ICD-10-CM

## 2022-08-31 DIAGNOSIS — M810 Age-related osteoporosis without current pathological fracture: Secondary | ICD-10-CM | POA: Diagnosis not present

## 2022-09-08 ENCOUNTER — Ambulatory Visit: Payer: Medicare Other

## 2022-09-23 DIAGNOSIS — Z23 Encounter for immunization: Secondary | ICD-10-CM | POA: Diagnosis not present

## 2022-10-08 ENCOUNTER — Ambulatory Visit
Admission: RE | Admit: 2022-10-08 | Discharge: 2022-10-08 | Disposition: A | Discharge status: Home / Self Care | Payer: Medicare Other | Source: Ambulatory Visit | Attending: Family Medicine | Admitting: Family Medicine

## 2022-10-08 ENCOUNTER — Ambulatory Visit: Payer: Medicare Other

## 2022-10-08 DIAGNOSIS — Z1231 Encounter for screening mammogram for malignant neoplasm of breast: Secondary | ICD-10-CM

## 2022-10-13 DIAGNOSIS — L821 Other seborrheic keratosis: Secondary | ICD-10-CM | POA: Diagnosis not present

## 2022-10-13 DIAGNOSIS — I788 Other diseases of capillaries: Secondary | ICD-10-CM | POA: Diagnosis not present

## 2022-10-13 DIAGNOSIS — D2271 Melanocytic nevi of right lower limb, including hip: Secondary | ICD-10-CM | POA: Diagnosis not present

## 2022-10-13 DIAGNOSIS — L304 Erythema intertrigo: Secondary | ICD-10-CM | POA: Diagnosis not present

## 2022-10-13 DIAGNOSIS — L720 Epidermal cyst: Secondary | ICD-10-CM | POA: Diagnosis not present

## 2022-10-13 DIAGNOSIS — D225 Melanocytic nevi of trunk: Secondary | ICD-10-CM | POA: Diagnosis not present

## 2022-10-13 DIAGNOSIS — L308 Other specified dermatitis: Secondary | ICD-10-CM | POA: Diagnosis not present

## 2022-10-13 DIAGNOSIS — L218 Other seborrheic dermatitis: Secondary | ICD-10-CM | POA: Diagnosis not present

## 2022-10-13 DIAGNOSIS — D2272 Melanocytic nevi of left lower limb, including hip: Secondary | ICD-10-CM | POA: Diagnosis not present

## 2022-11-10 DIAGNOSIS — H40053 Ocular hypertension, bilateral: Secondary | ICD-10-CM | POA: Diagnosis not present

## 2022-11-10 DIAGNOSIS — H353132 Nonexudative age-related macular degeneration, bilateral, intermediate dry stage: Secondary | ICD-10-CM | POA: Diagnosis not present

## 2022-11-10 DIAGNOSIS — H52203 Unspecified astigmatism, bilateral: Secondary | ICD-10-CM | POA: Diagnosis not present

## 2022-11-10 DIAGNOSIS — H2513 Age-related nuclear cataract, bilateral: Secondary | ICD-10-CM | POA: Diagnosis not present

## 2022-11-10 DIAGNOSIS — H40013 Open angle with borderline findings, low risk, bilateral: Secondary | ICD-10-CM | POA: Diagnosis not present

## 2023-02-11 DIAGNOSIS — H353132 Nonexudative age-related macular degeneration, bilateral, intermediate dry stage: Secondary | ICD-10-CM | POA: Diagnosis not present

## 2023-02-11 DIAGNOSIS — H40023 Open angle with borderline findings, high risk, bilateral: Secondary | ICD-10-CM | POA: Diagnosis not present

## 2023-02-11 DIAGNOSIS — H40053 Ocular hypertension, bilateral: Secondary | ICD-10-CM | POA: Diagnosis not present

## 2023-03-08 DIAGNOSIS — M810 Age-related osteoporosis without current pathological fracture: Secondary | ICD-10-CM | POA: Diagnosis not present

## 2023-03-16 DIAGNOSIS — H40051 Ocular hypertension, right eye: Secondary | ICD-10-CM | POA: Diagnosis not present

## 2023-03-23 DIAGNOSIS — H40052 Ocular hypertension, left eye: Secondary | ICD-10-CM | POA: Diagnosis not present

## 2023-06-01 DIAGNOSIS — H3530 Unspecified macular degeneration: Secondary | ICD-10-CM | POA: Diagnosis not present

## 2023-06-01 DIAGNOSIS — M1990 Unspecified osteoarthritis, unspecified site: Secondary | ICD-10-CM | POA: Diagnosis not present

## 2023-06-01 DIAGNOSIS — H409 Unspecified glaucoma: Secondary | ICD-10-CM | POA: Diagnosis not present

## 2023-06-01 DIAGNOSIS — Z79899 Other long term (current) drug therapy: Secondary | ICD-10-CM | POA: Diagnosis not present

## 2023-06-01 DIAGNOSIS — E559 Vitamin D deficiency, unspecified: Secondary | ICD-10-CM | POA: Diagnosis not present

## 2023-06-01 DIAGNOSIS — N952 Postmenopausal atrophic vaginitis: Secondary | ICD-10-CM | POA: Diagnosis not present

## 2023-06-01 DIAGNOSIS — Z131 Encounter for screening for diabetes mellitus: Secondary | ICD-10-CM | POA: Diagnosis not present

## 2023-06-01 DIAGNOSIS — M810 Age-related osteoporosis without current pathological fracture: Secondary | ICD-10-CM | POA: Diagnosis not present

## 2023-06-01 DIAGNOSIS — Z136 Encounter for screening for cardiovascular disorders: Secondary | ICD-10-CM | POA: Diagnosis not present

## 2023-06-01 DIAGNOSIS — J309 Allergic rhinitis, unspecified: Secondary | ICD-10-CM | POA: Diagnosis not present

## 2023-06-01 DIAGNOSIS — Z0000 Encounter for general adult medical examination without abnormal findings: Secondary | ICD-10-CM | POA: Diagnosis not present

## 2023-06-01 DIAGNOSIS — Z13220 Encounter for screening for lipoid disorders: Secondary | ICD-10-CM | POA: Diagnosis not present

## 2023-06-02 ENCOUNTER — Other Ambulatory Visit: Payer: Self-pay | Admitting: Family Medicine

## 2023-06-02 DIAGNOSIS — Z13820 Encounter for screening for osteoporosis: Secondary | ICD-10-CM

## 2023-08-31 ENCOUNTER — Other Ambulatory Visit: Payer: Self-pay | Admitting: Family Medicine

## 2023-08-31 DIAGNOSIS — Z1231 Encounter for screening mammogram for malignant neoplasm of breast: Secondary | ICD-10-CM

## 2023-09-13 DIAGNOSIS — M810 Age-related osteoporosis without current pathological fracture: Secondary | ICD-10-CM | POA: Diagnosis not present

## 2023-09-20 DIAGNOSIS — Z23 Encounter for immunization: Secondary | ICD-10-CM | POA: Diagnosis not present

## 2023-10-10 ENCOUNTER — Ambulatory Visit
Admission: RE | Admit: 2023-10-10 | Discharge: 2023-10-10 | Disposition: A | Discharge status: Home / Self Care | Payer: Medicare Other | Source: Ambulatory Visit | Attending: Family Medicine | Admitting: Family Medicine

## 2023-10-10 DIAGNOSIS — Z1231 Encounter for screening mammogram for malignant neoplasm of breast: Secondary | ICD-10-CM | POA: Diagnosis not present

## 2023-11-11 DIAGNOSIS — H2513 Age-related nuclear cataract, bilateral: Secondary | ICD-10-CM | POA: Diagnosis not present

## 2023-11-11 DIAGNOSIS — H353132 Nonexudative age-related macular degeneration, bilateral, intermediate dry stage: Secondary | ICD-10-CM | POA: Diagnosis not present

## 2023-11-11 DIAGNOSIS — H5203 Hypermetropia, bilateral: Secondary | ICD-10-CM | POA: Diagnosis not present

## 2023-12-12 DIAGNOSIS — D1801 Hemangioma of skin and subcutaneous tissue: Secondary | ICD-10-CM | POA: Diagnosis not present

## 2023-12-12 DIAGNOSIS — L309 Dermatitis, unspecified: Secondary | ICD-10-CM | POA: Diagnosis not present

## 2023-12-12 DIAGNOSIS — D225 Melanocytic nevi of trunk: Secondary | ICD-10-CM | POA: Diagnosis not present

## 2023-12-12 DIAGNOSIS — L738 Other specified follicular disorders: Secondary | ICD-10-CM | POA: Diagnosis not present

## 2023-12-12 DIAGNOSIS — L821 Other seborrheic keratosis: Secondary | ICD-10-CM | POA: Diagnosis not present

## 2023-12-13 ENCOUNTER — Ambulatory Visit
Admission: RE | Admit: 2023-12-13 | Discharge: 2023-12-13 | Disposition: A | Discharge status: Home / Self Care | Payer: Medicare Other | Source: Ambulatory Visit | Attending: Family Medicine | Admitting: Family Medicine

## 2023-12-13 DIAGNOSIS — M8588 Other specified disorders of bone density and structure, other site: Secondary | ICD-10-CM | POA: Diagnosis not present

## 2023-12-13 DIAGNOSIS — N958 Other specified menopausal and perimenopausal disorders: Secondary | ICD-10-CM | POA: Diagnosis not present

## 2023-12-13 DIAGNOSIS — E2839 Other primary ovarian failure: Secondary | ICD-10-CM | POA: Diagnosis not present

## 2023-12-13 DIAGNOSIS — Z13820 Encounter for screening for osteoporosis: Secondary | ICD-10-CM

## 2024-03-15 DIAGNOSIS — M810 Age-related osteoporosis without current pathological fracture: Secondary | ICD-10-CM | POA: Diagnosis not present

## 2024-06-01 ENCOUNTER — Other Ambulatory Visit (HOSPITAL_COMMUNITY): Payer: Self-pay | Admitting: Family Medicine

## 2024-06-01 DIAGNOSIS — Z1331 Encounter for screening for depression: Secondary | ICD-10-CM | POA: Diagnosis not present

## 2024-06-01 DIAGNOSIS — M1990 Unspecified osteoarthritis, unspecified site: Secondary | ICD-10-CM | POA: Diagnosis not present

## 2024-06-01 DIAGNOSIS — M810 Age-related osteoporosis without current pathological fracture: Secondary | ICD-10-CM | POA: Diagnosis not present

## 2024-06-01 DIAGNOSIS — H409 Unspecified glaucoma: Secondary | ICD-10-CM | POA: Diagnosis not present

## 2024-06-01 DIAGNOSIS — R7303 Prediabetes: Secondary | ICD-10-CM | POA: Diagnosis not present

## 2024-06-01 DIAGNOSIS — E7800 Pure hypercholesterolemia, unspecified: Secondary | ICD-10-CM | POA: Diagnosis not present

## 2024-06-01 DIAGNOSIS — N952 Postmenopausal atrophic vaginitis: Secondary | ICD-10-CM | POA: Diagnosis not present

## 2024-06-01 DIAGNOSIS — E559 Vitamin D deficiency, unspecified: Secondary | ICD-10-CM | POA: Diagnosis not present

## 2024-06-01 DIAGNOSIS — J309 Allergic rhinitis, unspecified: Secondary | ICD-10-CM | POA: Diagnosis not present

## 2024-06-01 DIAGNOSIS — Z0000 Encounter for general adult medical examination without abnormal findings: Secondary | ICD-10-CM | POA: Diagnosis not present

## 2024-06-01 DIAGNOSIS — H3530 Unspecified macular degeneration: Secondary | ICD-10-CM | POA: Diagnosis not present

## 2024-06-27 ENCOUNTER — Ambulatory Visit (HOSPITAL_COMMUNITY)
Admission: RE | Admit: 2024-06-27 | Discharge: 2024-06-27 | Disposition: A | Discharge status: Home / Self Care | Payer: Self-pay | Source: Ambulatory Visit | Attending: Family Medicine | Admitting: Family Medicine

## 2024-06-27 DIAGNOSIS — Z0000 Encounter for general adult medical examination without abnormal findings: Secondary | ICD-10-CM | POA: Insufficient documentation

## 2024-07-02 ENCOUNTER — Other Ambulatory Visit (HOSPITAL_COMMUNITY): Payer: Self-pay | Admitting: Family Medicine

## 2024-07-02 DIAGNOSIS — I77810 Thoracic aortic ectasia: Secondary | ICD-10-CM

## 2024-07-12 ENCOUNTER — Other Ambulatory Visit (HOSPITAL_COMMUNITY)

## 2024-07-12 ENCOUNTER — Ambulatory Visit (HOSPITAL_COMMUNITY)
Admission: RE | Admit: 2024-07-12 | Discharge: 2024-07-12 | Disposition: A | Discharge status: Home / Self Care | Source: Ambulatory Visit | Attending: Family Medicine | Admitting: Family Medicine

## 2024-07-12 DIAGNOSIS — I77810 Thoracic aortic ectasia: Secondary | ICD-10-CM | POA: Diagnosis not present

## 2024-07-12 DIAGNOSIS — J9811 Atelectasis: Secondary | ICD-10-CM | POA: Diagnosis not present

## 2024-07-12 DIAGNOSIS — I7121 Aneurysm of the ascending aorta, without rupture: Secondary | ICD-10-CM | POA: Diagnosis not present

## 2024-07-12 MED ORDER — IOHEXOL 350 MG/ML SOLN
100.0000 mL | Freq: Once | INTRAVENOUS | Status: AC | PRN
  Administered 2024-07-12: 100 mL via INTRAVENOUS

## 2024-08-06 ENCOUNTER — Ambulatory Visit

## 2024-08-06 VITALS — BP 142/88 | HR 69 | Resp 20 | Ht 62.0 in | Wt 124.0 lb

## 2024-08-06 DIAGNOSIS — I7121 Aneurysm of the ascending aorta, without rupture: Secondary | ICD-10-CM | POA: Insufficient documentation

## 2024-08-06 NOTE — Progress Notes (Signed)
 [address] [place] [address]             [phone]            KASSIDY DOCKENDORF [id number] September 26, 1951   History of Present Illness:  Ms. Gail Ellis is a 73 year old female with medical history of insomnia, osteoporosis, vitamin D deficiency, glaucoma, migraine, osteoarthritis, prediabetes, and hypercholesterolemia who presents today for initial encounter of ascending thoracic aortic aneurysm.  This was found incidentally on a cardiac calcium CT scan.  CTA of chest on 07/12/2024 measured aneurysm at 4.6 cm.   She presents to the clinic today with her husband.  She reports that she is doing well.  Her blood pressure is well controlled without the use of medications.  She does check her BP at home and reports readings in the 120s/80s.  She participates in silver sneakers for exercise.  She denies heavy lifting.  No personal and family history of connective tissue disorders. She denies chest pain, shortness of breath, lower leg swelling.   Current Outpatient Medications on File Prior to Visit  Medication Sig Dispense Refill   betamethasone dipropionate 0.05 % cream Apply topically daily.     denosumab  (PROLIA ) 60 MG/ML SOSY injection Inject 60 mg into the skin every 6 (six) months.     HYDROcodone-acetaminophen (NORCO/VICODIN) 5-325 MG tablet Take 1 tablet by mouth every 6 (six) hours as needed for moderate pain (pain score 4-6).     meloxicam (MOBIC) 7.5 MG tablet Take 7.5 mg by mouth daily.     No current facility-administered medications on file prior to visit.     ROS: Review of Systems  Constitutional: Negative.  Negative for malaise/fatigue.  Respiratory: Negative.  Negative for cough, shortness of breath and wheezing.   Cardiovascular: Negative.  Negative for chest pain and leg swelling.  Musculoskeletal:  Positive for joint pain.     BP (!) 142/88   Pulse 69   Resp 20   Ht 5' 2 (1.575 m)   Wt 124 lb (56.2 kg)   SpO2 97%  Comment: RA  BMI 22.68 kg/m   Physical Exam Constitutional:      Appearance: Normal appearance.  HENT:     Head: Normocephalic and atraumatic.  Cardiovascular:     Rate and Rhythm: Normal rate and regular rhythm.     Heart sounds: Normal heart sounds, S1 normal and S2 normal.  Pulmonary:     Effort: Pulmonary effort is normal.     Breath sounds: Normal breath sounds.  Musculoskeletal:     Cervical back: Normal range of motion.  Skin:    General: Skin is warm and dry.  Neurological:     General: No focal deficit present.     Mental Status: She is alert.      Imaging: EXAM: CTA CHEST AORTA 07/12/2024 02:48:10 PM   TECHNIQUE: CTA of the chest was performed after the administration of intravenous contrast. Multiplanar reformatted images are provided for review. MIP images are provided for review. Automated exposure control, iterative reconstruction, and/or weight based adjustment of the mA/kV was utilized to reduce the radiation dose to as low as reasonably achievable.   COMPARISON: 06/27/2024   CLINICAL HISTORY: Aortic root dilation; Omnipaque  350; 100 cc; Aneurysm seen on calcium score 2 weeks ago; No complaints   FINDINGS:   AORTA: 4.6 cm fusiform ascending thoracic aortic aneurysm. Arch measures 3.5 cm in diameter, proximal descending  3.4 cm. Scattered plaque in the proximal abdominal aorta.   MEDIASTINUM: Borderline dilatation of the main pulmonary artery 3.1 cm. The heart and pericardium demonstrate no acute abnormality.   LYMPH NODES: No mediastinal, hilar or axillary lymphadenopathy.   LUNGS AND PLEURA: Mild dependent atelectasis in both lower lobes. No focal consolidation or pulmonary edema. No pleural effusion or pneumothorax.   UPPER ABDOMEN: Limited images of the upper abdomen are unremarkable.   SOFT TISSUES AND BONES: Vertebral endplate spurring at multiple levels in the lower thoracic spine. Bilateral shoulder DJD. No acute bone or soft  tissue abnormality.   IMPRESSION: 1. 4.6 cm fusiform ascending thoracic aortic aneurysm, stable. 2. Borderline dilatation of the main pulmonary artery measuring 3.1 cm.   Electronically signed by: Dayne Hassell MD 07/21/2024 12:55 PM EDT RP Workstation: [id number]    A/P:  Aneurysm of ascending aorta without rupture (HCC) -4.6 cm ascending thoracic aortic aneurysm on CTA of chest. We discussed the natural history and and risk factors for growth of ascending aortic aneurysms. Discussed recommendations to minimize the risk of further expansion or dissection including careful blood pressure control, avoidance of contact sports and heavy lifting, attention to lipid management.  We covered the importance of continued smoking cessation.  The patient does not yet meet surgical criteria of >5.5cm. The patient is aware of signs and symptoms of aortic dissection and when to present to the emergency department   -Follow up in 6 months with CTA of chest for continued surveillance     Risk Modification:  Statin:  not currently prescribed  Smoking cessation instruction/counseling given:  commended patient for quitting and reviewed strategies for preventing relapses  Patient was counseled on importance of Blood Pressure Control  They are instructed to contact their Primary Care Physician if they start to have blood pressure readings over 130s/90s. Do not ever stop blood pressure medications on your own, unless instructed by healthcare professional.  Please avoid use of Fluoroquinolones as this can potentially increase your risk of Aortic Rupture and/or Dissection  Patient educated on signs and symptoms of Aortic Dissection, handout also provided in AVS  Manuelita CHRISTELLA Rough, PA-C 08/06/24

## 2024-08-06 NOTE — Patient Instructions (Signed)
 Risk Modification in those with ascending thoracic aortic aneurysm:   Continue control of blood pressure (prefer BP 130/80 or less)   2. Avoid fluoroquinolone antibiotics (I.e Ciprofloxacin, Avelox, Levofloxacin, Ofloxacin)   3.  Use of statin (to decrease cardiovascular risk)   4.  Exercise and activity limitations is individualized, but in general, contact sports are to be avoided and one should avoid heavy lifting (defined as half of ideal body weight) and exercises involving sustained Valsalva maneuver.   5.  Follow-up in 6 months with CTA chest.  OK to use a non-contrast CT if you have had a recent study for surveillance of the lung nodule.

## 2024-08-24 DIAGNOSIS — Z23 Encounter for immunization: Secondary | ICD-10-CM | POA: Diagnosis not present

## 2024-08-27 ENCOUNTER — Other Ambulatory Visit: Payer: Self-pay | Admitting: Family Medicine

## 2024-08-27 DIAGNOSIS — Z1231 Encounter for screening mammogram for malignant neoplasm of breast: Secondary | ICD-10-CM

## 2024-09-24 DIAGNOSIS — M810 Age-related osteoporosis without current pathological fracture: Secondary | ICD-10-CM | POA: Diagnosis not present

## 2024-10-10 ENCOUNTER — Ambulatory Visit
Admission: RE | Admit: 2024-10-10 | Discharge: 2024-10-10 | Disposition: A | Discharge status: Home / Self Care | Source: Ambulatory Visit | Attending: Family Medicine | Admitting: Family Medicine

## 2024-10-10 DIAGNOSIS — Z1231 Encounter for screening mammogram for malignant neoplasm of breast: Secondary | ICD-10-CM
